# Patient Record
Sex: Male | Born: 1965 | Marital: Single | Smoking: Never smoker
Health system: Southern US, Academic
[De-identification: ages and names within clinical notes are randomized; demographics above are authoritative.]

## PROBLEM LIST (undated history)

## (undated) DIAGNOSIS — Z973 Presence of spectacles and contact lenses: Secondary | ICD-10-CM

## (undated) DIAGNOSIS — K5190 Ulcerative colitis, unspecified, without complications: Secondary | ICD-10-CM

## (undated) DIAGNOSIS — K219 Gastro-esophageal reflux disease without esophagitis: Secondary | ICD-10-CM

## (undated) HISTORY — PX: COLONOSCOPY: WVUENDOPRO10

## (undated) HISTORY — PX: HX KNEE ARTHROSCOPY: SHX127

## (undated) HISTORY — PX: OTHER SURGICAL HISTORY: SHX170

## (undated) HISTORY — PX: GASTROSCOPY: WVUENDOPRO49

---

## 2019-09-08 IMAGING — MR MRI UPPER EXTREMITY WITHOUT AND WITH CONTRAST RT
9 of 10 series · 37 of 40 positions shown · IV contrast (gadavist)
Comparison: None available.

EXAM:  MRI UPPER EXTREMITY WITHOUT AND WITH CONTRAST RT
INDICATION: Recent biceps injury.
TECHNIQUE: Multiplanar multisequential MRI of the right upper arm was performed without and with 6 mL of Gadavist.

[Series 9: T1 fat-sat · sagittal · right · 7.0mm · 1.33mm/px · 4 of 20 slices shown (1 of 3)]
[im 1/20]
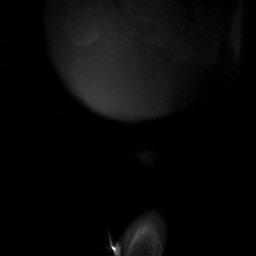
[im 7/20]
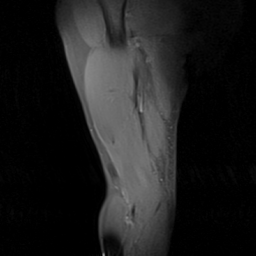
[im 13/20]
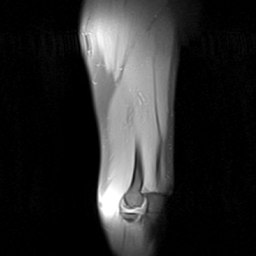
[im 20/20]
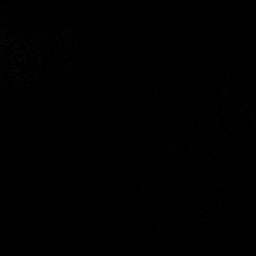

[Series 12: T1 · axial · right · 7.0mm · 0.39mm/px · z∈[-151,+115]mm · 6 of 36 slices shown (1 of 3)]
[im 1/36]
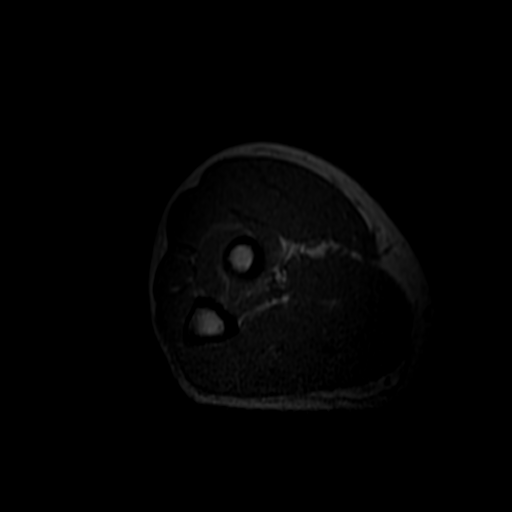
[im 8/36]
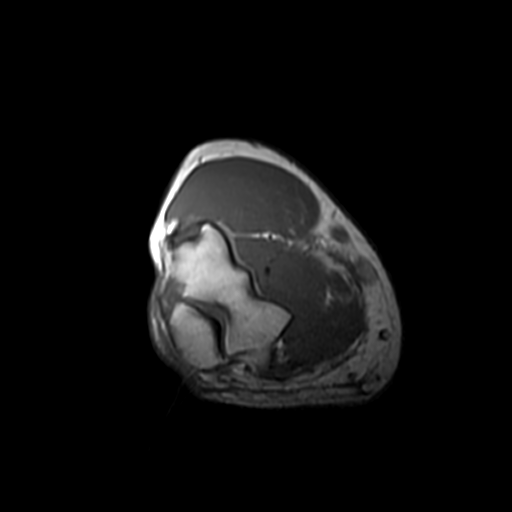
[im 15/36]
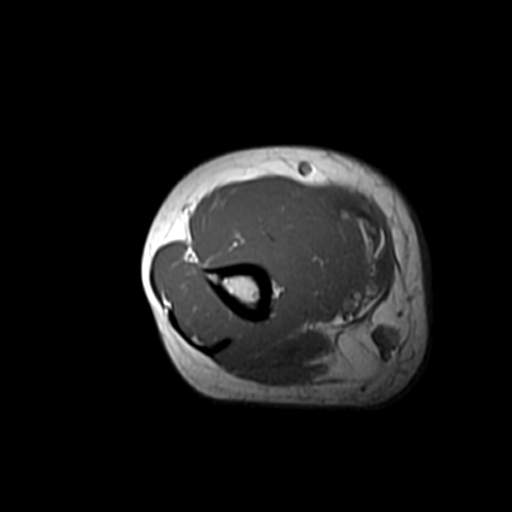
[im 22/36]
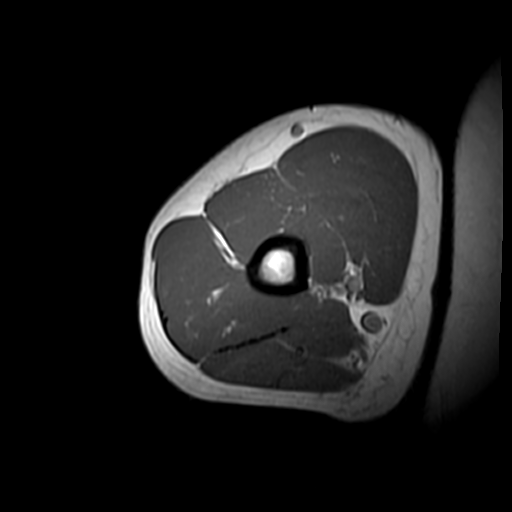
[im 29/36]
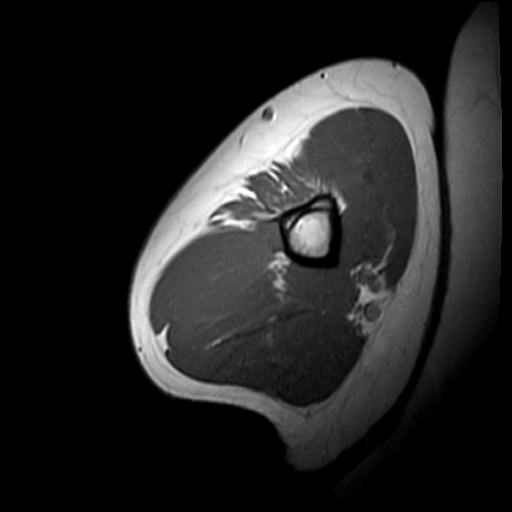
[im 36/36]
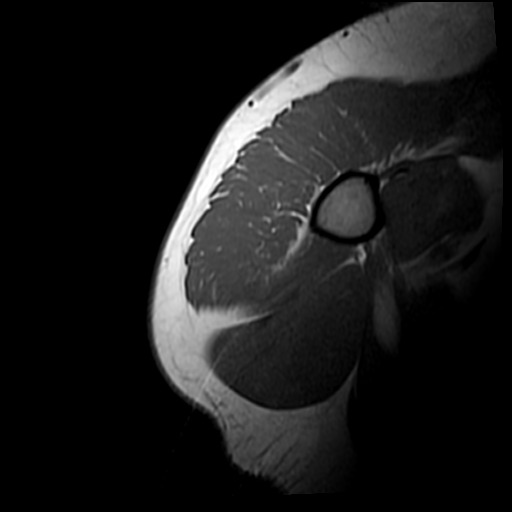

[Series 14: T2 fat-sat · axial · right · 7.0mm · 0.78mm/px · z∈[-151,+115]mm · 5 of 36 slices shown]
[im 1/36]
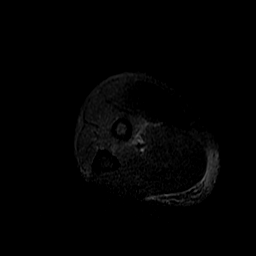
[im 9/36]
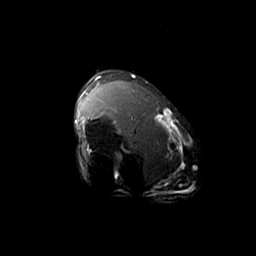
[im 18/36]
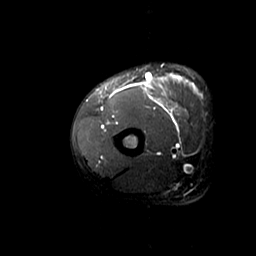
[im 27/36]
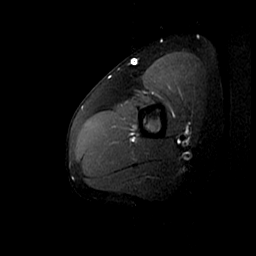
[im 36/36]
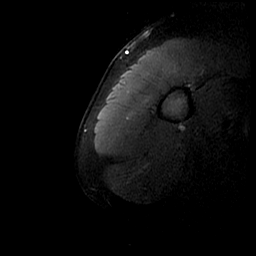

[Series 16: T1 fat-sat post-contrast · axial · right · 7.0mm · 0.78mm/px · z∈[-151,+115]mm · 5 of 36 slices shown (1 of 2)]
[im 1/36]
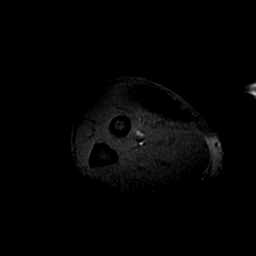
[im 9/36]
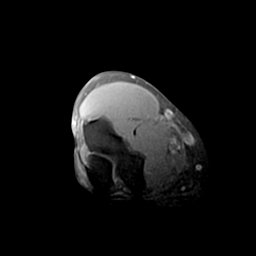
[im 18/36]
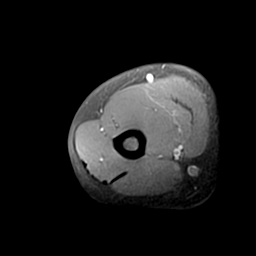
[im 27/36]
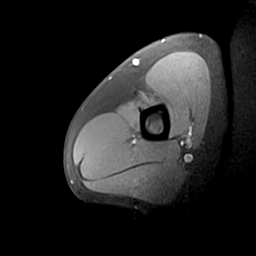
[im 36/36]
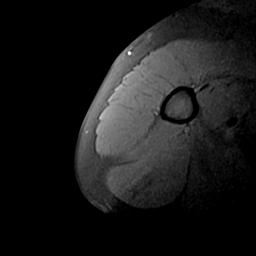

[Series 19: T1 · sagittal · right · 7.0mm · 0.66mm/px · 3 of 20 slices shown (2 of 3)]
[im 1/20]
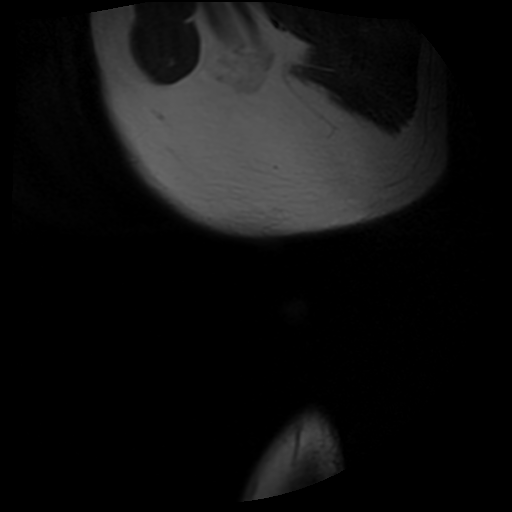
[im 10/20]
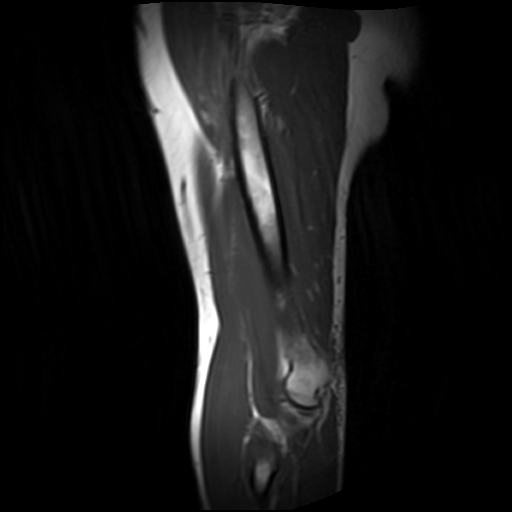
[im 20/20]
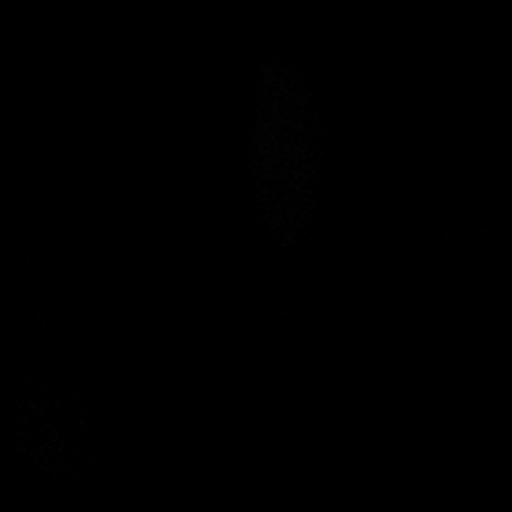

[Series 22: T1 fat-sat · sagittal · right · 7.0mm · 1.33mm/px · 3 of 20 slices shown (2 of 3)]
[im 1/20]
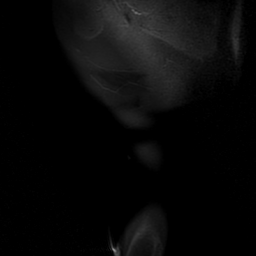
[im 10/20]
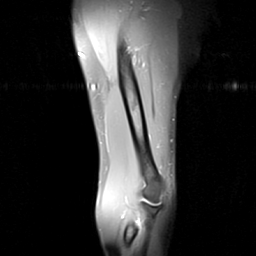
[im 20/20]
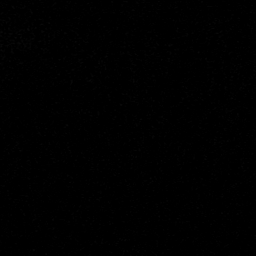

[Series 23: T1 fat-sat · axial · right · 7.0mm · 0.78mm/px · z∈[-151,+115]mm · 5 of 36 slices shown (3 of 3)]
[im 1/36]
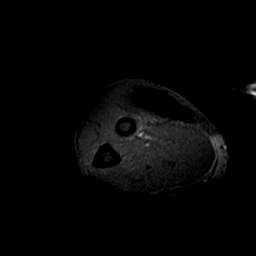
[im 9/36]
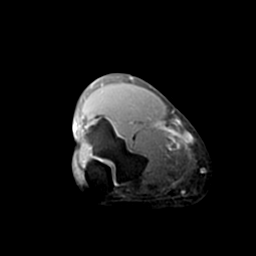
[im 18/36]
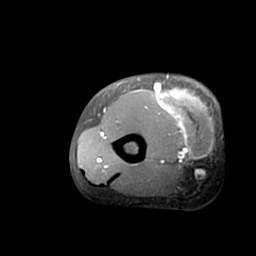
[im 27/36]
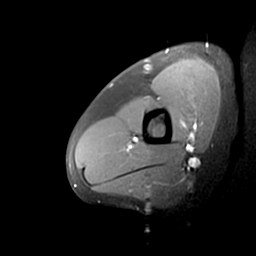
[im 36/36]
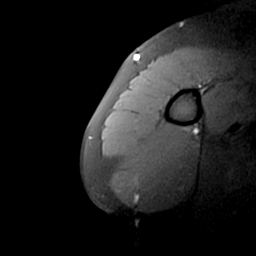

[Series 24: T1 fat-sat post-contrast · coronal · right · 6.0mm · 1.33mm/px · 3 of 22 slices shown (2 of 2)]
[im 1/22]
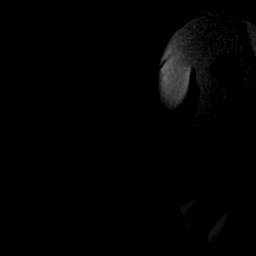
[im 11/22]
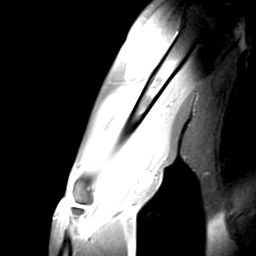
[im 22/22]
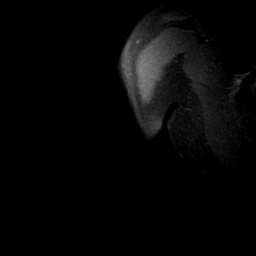

[Series 28: T1 · axial · right · 7.0mm · 0.39mm/px · z∈[-77,+79]mm · 3 of 22 slices shown (3 of 3)]
[im 1/22]
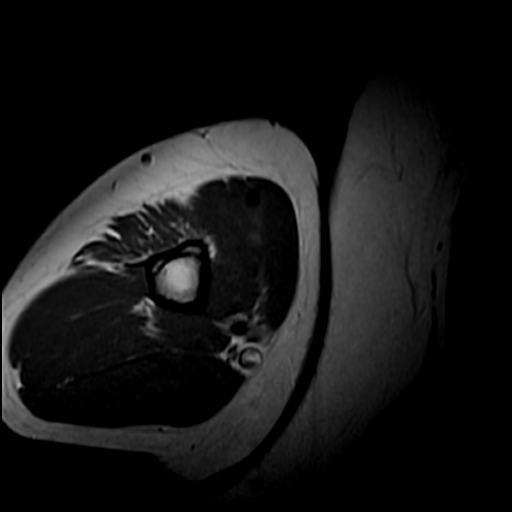
[im 11/22]
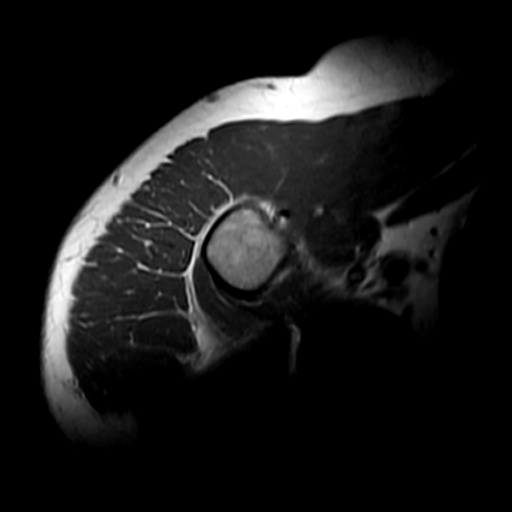
[im 22/22]
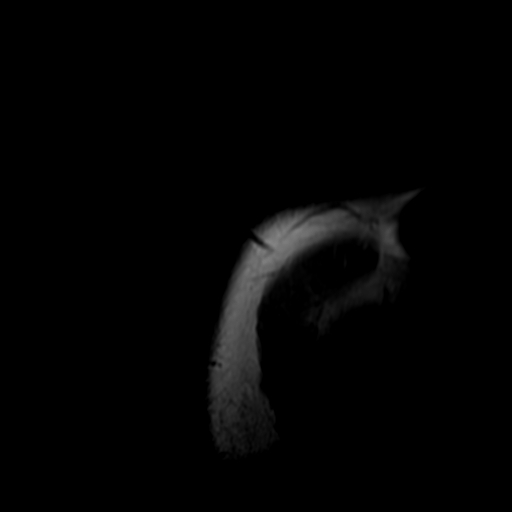

[37 of 40 positions shown; findings below may reference images not displayed]

FINDINGS: Bone marrow signal intensity is normal. There is no acute fracture or subluxation. The distal biceps tendon is completely torn and significantly retracted, located at the level of the distal upper arm. Moderate amount of blood is also noted surrounding the tendon. No suspicious soft tissue mass is seen there is no abnormal enhancement.
IMPRESSION: Completely torn and retracted distal biceps tendon.

## 2019-09-16 ENCOUNTER — Ambulatory Visit (HOSPITAL_BASED_OUTPATIENT_CLINIC_OR_DEPARTMENT_OTHER): Payer: BC Managed Care – PPO

## 2019-09-16 ENCOUNTER — Encounter (HOSPITAL_BASED_OUTPATIENT_CLINIC_OR_DEPARTMENT_OTHER): Payer: Self-pay | Admitting: ORTHOPEDIC, SPORTS MEDICINE

## 2019-09-16 ENCOUNTER — Ambulatory Visit
Admission: RE | Admit: 2019-09-16 | Discharge: 2019-09-16 | Disposition: A | Discharge status: Home / Self Care | Payer: BC Managed Care – PPO | Source: Ambulatory Visit | Attending: ORTHOPEDIC, SPORTS MEDICINE | Admitting: ORTHOPEDIC, SPORTS MEDICINE

## 2019-09-16 ENCOUNTER — Other Ambulatory Visit (HOSPITAL_BASED_OUTPATIENT_CLINIC_OR_DEPARTMENT_OTHER): Payer: Self-pay | Admitting: ORTHOPEDIC, SPORTS MEDICINE

## 2019-09-16 ENCOUNTER — Ambulatory Visit (HOSPITAL_BASED_OUTPATIENT_CLINIC_OR_DEPARTMENT_OTHER)
Admission: RE | Admit: 2019-09-16 | Discharge: 2019-09-16 | Disposition: A | Discharge status: Home / Self Care | Payer: BC Managed Care – PPO | Source: Ambulatory Visit

## 2019-09-16 ENCOUNTER — Ambulatory Visit (HOSPITAL_BASED_OUTPATIENT_CLINIC_OR_DEPARTMENT_OTHER): Payer: BC Managed Care – PPO | Admitting: ORTHOPEDIC, SPORTS MEDICINE

## 2019-09-16 ENCOUNTER — Other Ambulatory Visit: Payer: Self-pay

## 2019-09-16 VITALS — BP 138/86 | HR 84 | Temp 96.8°F | Ht 71.46 in | Wt 284.4 lb

## 2019-09-16 DIAGNOSIS — S46211A Strain of muscle, fascia and tendon of other parts of biceps, right arm, initial encounter: Secondary | ICD-10-CM

## 2019-09-16 DIAGNOSIS — Z01818 Encounter for other preprocedural examination: Secondary | ICD-10-CM | POA: Insufficient documentation

## 2019-09-16 LAB — CBC WITH DIFF
BASOPHIL #: <0.1 10*3/uL (ref <=0.20)
BASOPHIL %: 1 %
EOSINOPHIL #: 0.25 10*3/uL (ref <=0.50)
EOSINOPHIL %: 4 %
HCT: 46.1 % (ref 38.9–52.0)
HGB: 15.6 g/dL (ref 13.4–17.5)
IMMATURE GRANULOCYTE #: <0.1 10*3/uL (ref <0.10)
IMMATURE GRANULOCYTE %: 1 % (ref 0–1)
LYMPHOCYTE #: 1.05 10*3/uL (ref 1.00–4.80)
LYMPHOCYTE %: 17 %
MCH: 31.5 pg (ref 26.0–32.0)
MCHC: 33.8 g/dL (ref 31.0–35.5)
MCV: 92.9 fL (ref 78.0–100.0)
MONOCYTE #: 0.43 10*3/uL (ref 0.20–1.10)
MONOCYTE %: 7 %
MPV: 9.5 fL (ref 8.7–12.5)
NEUTROPHIL #: 4.45 10*3/uL (ref 1.50–7.70)
NEUTROPHIL %: 70 %
PLATELETS: 180 10*3/uL (ref 150–400)
RBC: 4.96 10*6/uL (ref 4.50–6.10)
RDW-CV: 13.1 % (ref 11.5–15.5)
WBC: 6.3 10*3/uL (ref 3.7–11.0)

## 2019-09-16 LAB — BASIC METABOLIC PANEL
ANION GAP: 9 mmol/L (ref 4–13)
BUN/CREA RATIO: 11 (ref 6–22)
BUN: 15 mg/dL (ref 8–25)
CALCIUM: 9.3 mg/dL (ref 8.5–10.2)
CHLORIDE: 105 mmol/L (ref 96–111)
CO2 TOTAL: 25 mmol/L (ref 22–32)
CREATININE: 1.31 mg/dL — ABNORMAL HIGH (ref 0.62–1.27)
GLUCOSE: 110 mg/dL (ref 65–139)
POTASSIUM: 4.3 mmol/L (ref 3.5–5.1)
SODIUM: 139 mmol/L (ref 136–145)

## 2019-09-16 LAB — URINALYSIS, MICROSCOPIC
RBCS: 0 /HPF (ref <6.0)
WBCS: <1 /HPF (ref <4.0)

## 2019-09-16 LAB — ECG 12-LEAD (UTC ONLY)
Atrial Rate: 70 {beats}/min
Calculated P Axis: 43 degrees
Calculated T Axis: 35 degrees
PR Interval: 150 ms
QRS Duration: 74 ms
QT Interval: 350 ms
QTC Calculation: 378 ms
Ventricular rate: 70 {beats}/min

## 2019-09-16 LAB — URINALYSIS, MACROSCOPIC
BILIRUBIN: NEGATIVE mg/dL
BLOOD: NEGATIVE mg/dL
COLOR: NORMAL
GLUCOSE: NEGATIVE mg/dL
KETONES: NEGATIVE mg/dL
LEUKOCYTES: NEGATIVE WBCs/uL
NITRITE: NEGATIVE
PH: 7 (ref 5.0–8.0)
PROTEIN: NEGATIVE mg/dL
SPECIFIC GRAVITY: 1.017 (ref 1.005–1.030)
UROBILINOGEN: NEGATIVE mg/dL

## 2019-09-16 LAB — PT/INR
INR: 0.98 (ref 0.80–1.20)
PROTHROMBIN TIME: 11.3 s (ref 9.1–13.9)

## 2019-09-16 NOTE — H&P (Signed)
[facility]           SPORTS MEDICINE, [place]  [address]  [place] [address]  [phone]    NAME: FALLOU HULBERT  MEDICAL RECORD [id number]  DATE OF BIRTH: [birth year]  DATE OF ENCOUNTER: 09/16/2019    CHIEF COMPLAINT:  Right elbow pain    HISTORY OF PRESENT ILLNESS:  DOUGLES KIMMEY is a 53 y.o. right hand-dominant male that presents to clinic today for evaluation of a right elbow injury in which she injured himself on the job approximately 3 and half weeks ago.  Patient was caring a washing machine and suffered an injury when she felt a pop to the antecubital area of his right elbow.  No prior right elbow or upper extremity issues.  Patient indicates that he had immediate pain with deformity and ecchymosis developed.  Patient was seen at [facility] physicians in [place], [place] where referral was placed given concerns from a distal biceps rupture.  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  Patient denies any radicular pain, numbness or tingling to the Right upper extremity.    PAST MEDICAL HISTORY  GERD    PAST SURGICAL HISTORY  None    BMI  Body mass index is 39.16 kg/m.    MEDICATIONS  Current Outpatient Medications   Medication Sig   . ascorbic acid, vitamin C, (VITAMIN C) 500 mg Oral Tablet Take 500 mg by mouth Once a day   . ergocalciferol, vitamin D2, (VITAMIN D2 ORAL) Take by mouth   . Ibuprofen (MOTRIN) 200 mg Oral Tablet Take 200 mg by mouth Four times a day as needed for Pain   . omeprazole (PRILOSEC) 40 mg Oral Capsule, Delayed Release(E.C.) Take 40 mg by mouth Once a day   . zinc 50 mg Oral Tablet Take 50 mg by mouth Once a day     ALLERGIES  No Known Allergies    SOCIAL HISTORY  Social History     Tobacco Use   . Smoking status: Never Smoker   . Smokeless tobacco: Former Estate agent Use Topics   . Alcohol use: Not on file   . Drug use: Not on file              FAMILY HISTORY    Father had a history of myocardial infarction in his mid 36s.      No history of bleeding disorders or adverse reactions to anesthesia     REVIEW OF SYSTEMS  Musculoskeletal and neurologic systems reviewed per the HPI and all others negative except for those that are also listed on the intake form.    PHYSICAL EXAM  BP 138/86   Pulse 84   Temp 36 C (96.8 F)   Ht 1.815 m (5' 11.46")   Wt 129 kg (284 lb 6.3 oz)   BMI 39.16 kg/m     Patient is alert and oriented x3, no apparent distress, compliant with medical examination.  Upon inspection of the right upper extremity, he has a reverse Popeye deformity of the biceps.  He has a positive hook test on the right.  Patient has a cramping sensation with resisted supination.  Patient has full flexion extension of the right elbow and full pronation and supination of the forearm.  Otherwise motor and sensory intact, distal pulses intact right upper extremity.    IMAGING:  MRI of the Right elbow taken  at an outside facility shows evidence of a full-thickness distal biceps rupture with retraction of the tendon stump approximately 11 centimeters proximal to the radial tuberosity.  No other musculoskeletal abnormalities are apparent.    AP and lateral chest x-ray with interpretation from the radiologist shows no overt cardiopulmonary process with normal contours of the cardiac silhouette.  Normal hilar lung markings with intermittent opacities to the right and left lung tissues that are small and circumscribed in nature.  No evidence of any osseous abnormalities.    ASSESSMENT: 53 y.o. male with a Right distal biceps rupture.    PLAN:  We discussed with the patient the nature of the condition and the functional impairments as a consequence.  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  We discussed the prospects of right distal biceps repair through a one-incision approach.  Patient wishes to proceed.  Risks and benefits of the  surgery discussed with the patient including, but not limited to, bleeding, infection, pain, damage to adjacent structures, need for further surgery, risk of the repair not healing or re-tearing, potential for elbow stiffness, potential for residual elbow weakness, risk of developing compartment syndrome, risk for development of heterotopic ossification or synostosis, transient or permanent loss of sensation overlying the skin including lateral cutaneous nerve of the forearm, risks of anesthesia, potential for development of a DVT.  Patient understands that they will need to comply with post-operative restrictions and participate in physical therapy to maximize outcome.  Patient has signed the informed consent for single incision distal biceps repair.  If the patient has any questions or concerns, they will contact our office.    Laboratory work, chest x-ray, and ECG were ordered.  Patient does not have a PCP.  Will have the patient be seen in PAT appointment.    Also, patient was given a tube of benzoyl peroxide to help guard against bacterial burden of C. Acnes.   Patient was given instructions for administration 4 days prior to procedure with the dosing twice a day, with the last dose on the morning of surgery.      As part of today's pre-operative visit we also discussed the risk of opioid addiction associated with Freeman Caldron Trigueros's post operative medication of Norco. We reviewed the CSMP for any current or previously prescribed medications. We discussed that this medication must be taken as prescribed. It is not to be taken with any alcohol or mood altering drugs. We discussed transitioning to an over-the-counter pain medication as quickly as possible. It was explained that should this medication be lost, stolen or damaged it will not be replaced, for this reason we explained the importance of keeping this prescription in a secure and safe location. We explained should a refill need provided this refill will  deescalate in strength and or duration. Kermit Balo was given a copy of the current Opioid Reduction Act at today's visit and a pain contract was signed at today's visit.       Drucilla Schmidt, MD

## 2019-09-17 LAB — HGA1C (HEMOGLOBIN A1C WITH EST AVG GLUCOSE)
ESTIMATED AVERAGE GLUCOSE: 91 mg/dL
HEMOGLOBIN A1C: 4.8 % (ref 4.0–5.6)

## 2019-09-18 ENCOUNTER — Encounter (HOSPITAL_COMMUNITY): Payer: Self-pay

## 2019-09-21 ENCOUNTER — Encounter (HOSPITAL_COMMUNITY): Payer: Self-pay

## 2019-09-22 ENCOUNTER — Inpatient Hospital Stay (HOSPITAL_COMMUNITY)
Admission: RE | Admit: 2019-09-22 | Discharge: 2019-09-22 | Disposition: A | Discharge status: Home / Self Care | Payer: BC Managed Care – PPO | Source: Ambulatory Visit

## 2019-09-22 ENCOUNTER — Encounter (HOSPITAL_COMMUNITY): Payer: Self-pay

## 2019-09-22 ENCOUNTER — Other Ambulatory Visit: Payer: Self-pay

## 2019-09-22 HISTORY — DX: Presence of spectacles and contact lenses: Z97.3

## 2019-09-22 HISTORY — DX: Gastro-esophageal reflux disease without esophagitis: K21.9

## 2019-09-23 MED ORDER — DEXTROSE 5 % IN WATER (D5W) INTRAVENOUS SOLUTION
3.0000 g | Freq: Once | INTRAVENOUS | Status: AC
Start: 2019-09-24 — End: 2019-09-24
  Administered 2019-09-24: 3 g via INTRAVENOUS
  Filled 2019-09-23: qty 30

## 2019-09-24 ENCOUNTER — Encounter (HOSPITAL_COMMUNITY): Payer: Self-pay | Admitting: ORTHOPEDIC, SPORTS MEDICINE

## 2019-09-24 ENCOUNTER — Inpatient Hospital Stay
Admission: RE | Admit: 2019-09-24 | Discharge: 2019-09-24 | Disposition: A | Discharge status: Home / Self Care | Payer: BC Managed Care – PPO | Source: Ambulatory Visit | Attending: ORTHOPEDIC, SPORTS MEDICINE | Admitting: ORTHOPEDIC, SPORTS MEDICINE

## 2019-09-24 ENCOUNTER — Ambulatory Visit (HOSPITAL_COMMUNITY)
Admission: RE | Admit: 2019-09-24 | Discharge: 2019-09-24 | Disposition: A | Discharge status: Home / Self Care | Payer: BC Managed Care – PPO | Source: Ambulatory Visit

## 2019-09-24 ENCOUNTER — Other Ambulatory Visit: Payer: Self-pay

## 2019-09-24 ENCOUNTER — Ambulatory Visit (HOSPITAL_COMMUNITY): Payer: BC Managed Care – PPO | Admitting: ANESTHESIOLOGY

## 2019-09-24 ENCOUNTER — Ambulatory Visit (HOSPITAL_COMMUNITY): Payer: BC Managed Care – PPO | Admitting: ORTHOPEDIC, SPORTS MEDICINE

## 2019-09-24 ENCOUNTER — Ambulatory Visit (HOSPITAL_BASED_OUTPATIENT_CLINIC_OR_DEPARTMENT_OTHER): Payer: BC Managed Care – PPO | Admitting: ANESTHESIOLOGY

## 2019-09-24 ENCOUNTER — Encounter (HOSPITAL_COMMUNITY)
Admission: RE | Disposition: A | Discharge status: Home / Self Care | Payer: Self-pay | Source: Ambulatory Visit | Attending: ORTHOPEDIC, SPORTS MEDICINE

## 2019-09-24 DIAGNOSIS — S46201A Unspecified injury of muscle, fascia and tendon of other parts of biceps, right arm, initial encounter: Secondary | ICD-10-CM

## 2019-09-24 DIAGNOSIS — S46211A Strain of muscle, fascia and tendon of other parts of biceps, right arm, initial encounter: Secondary | ICD-10-CM | POA: Insufficient documentation

## 2019-09-24 HISTORY — DX: Ulcerative colitis, unspecified, without complications (CMS HCC): K51.90

## 2019-09-24 SURGERY — REPAIR TENDON BICEPS
Anesthesia: General | Site: Shoulder | Laterality: Right | Wound class: Clean Wound: Uninfected operative wounds in which no inflammation occurred

## 2019-09-24 MED ORDER — FENTANYL (PF) 50 MCG/ML INJECTION SOLUTION
Freq: Once | INTRAMUSCULAR | Status: DC | PRN
Start: 2019-09-24 — End: 2019-09-24
  Administered 2019-09-24: 25 ug via INTRAVENOUS
  Administered 2019-09-24: 50 ug via INTRAVENOUS
  Administered 2019-09-24: 25 ug via INTRAVENOUS
  Administered 2019-09-24: 100 ug via INTRAVENOUS

## 2019-09-24 MED ORDER — LIDOCAINE (PF) 100 MG/5 ML (2 %) INTRAVENOUS SYRINGE
INJECTION | Freq: Once | INTRAVENOUS | Status: DC | PRN
Start: 2019-09-24 — End: 2019-09-24
  Administered 2019-09-24: 100 mg via INTRAVENOUS

## 2019-09-24 MED ORDER — SODIUM CHLORIDE 0.9 % IRRIGATION SOLUTION
Freq: Once | OPHTHALMIC | Status: DC | PRN
Start: 2019-09-24 — End: 2019-09-24
  Administered 2019-09-24: 13:00:00

## 2019-09-24 MED ORDER — PROPOFOL 10 MG/ML IV BOLUS
INJECTION | Freq: Once | INTRAVENOUS | Status: DC | PRN
Start: 2019-09-24 — End: 2019-09-24
  Administered 2019-09-24: 50 mg via INTRAVENOUS
  Administered 2019-09-24: 200 mg via INTRAVENOUS

## 2019-09-24 MED ORDER — EPHEDRINE SULFATE 50 MG/ML INJECTION SOLUTION
Freq: Once | INTRAMUSCULAR | Status: DC | PRN
Start: 2019-09-24 — End: 2019-09-24
  Administered 2019-09-24 (×2): 5 mg via INTRAVENOUS

## 2019-09-24 MED ORDER — GLYCOPYRROLATE 0.2 MG/ML INJECTION SOLUTION
Freq: Once | INTRAMUSCULAR | Status: DC | PRN
Start: 2019-09-24 — End: 2019-09-24
  Administered 2019-09-24: 0.8 mg via INTRAVENOUS

## 2019-09-24 MED ORDER — DOCUSATE SODIUM 100 MG CAPSULE
100.00 mg | ORAL_CAPSULE | Freq: Two times a day (BID) | ORAL | 0 refills | Status: AC
Start: 2019-09-24 — End: 2019-10-04

## 2019-09-24 MED ORDER — FENTANYL (PF) 50 MCG/ML INJECTION SOLUTION
25.0000 ug | INTRAMUSCULAR | Status: AC | PRN
Start: 2019-09-24 — End: 2019-09-24
  Administered 2019-09-24 (×4): 25 ug via INTRAVENOUS
  Filled 2019-09-24: qty 2

## 2019-09-24 MED ORDER — ROCURONIUM 10 MG/ML INTRAVENOUS SOLUTION
Freq: Once | INTRAVENOUS | Status: DC | PRN
Start: 2019-09-24 — End: 2019-09-24
  Administered 2019-09-24: 50 mg via INTRAVENOUS

## 2019-09-24 MED ORDER — FENTANYL (PF) 50 MCG/ML INJECTION SOLUTION
12.5000 ug | INTRAMUSCULAR | Status: DC | PRN
Start: 2019-09-24 — End: 2019-09-24

## 2019-09-24 MED ORDER — NEOSTIGMINE METHYLSULFATE 5 MG/5 ML (1 MG/ML) INTRAVENOUS SYRINGE
INJECTION | Freq: Once | INTRAVENOUS | Status: DC | PRN
Start: 2019-09-24 — End: 2019-09-24
  Administered 2019-09-24: 4 mg via INTRAVENOUS

## 2019-09-24 MED ORDER — MIDAZOLAM 1 MG/ML INJECTION SOLUTION
Freq: Once | INTRAMUSCULAR | Status: DC | PRN
Start: 2019-09-24 — End: 2019-09-24
  Administered 2019-09-24: 2 mg via INTRAVENOUS

## 2019-09-24 MED ORDER — METOCLOPRAMIDE 5 MG/ML INJECTION SOLUTION
10.0000 mg | Freq: Once | INTRAMUSCULAR | Status: DC | PRN
Start: 2019-09-24 — End: 2019-09-24
  Filled 2019-09-24: qty 2

## 2019-09-24 MED ORDER — ONDANSETRON HCL (PF) 4 MG/2 ML INJECTION SOLUTION
4.0000 mg | Freq: Once | INTRAMUSCULAR | Status: DC
Start: 2019-09-24 — End: 2019-09-24

## 2019-09-24 MED ORDER — LACTATED RINGERS INTRAVENOUS SOLUTION
INTRAVENOUS | Status: DC
Start: 2019-09-24 — End: 2019-09-24
  Administered 2019-09-24 (×2): via INTRAVENOUS
  Administered 2019-09-24: 0 via INTRAVENOUS
  Administered 2019-09-24 (×3): via INTRAVENOUS

## 2019-09-24 MED ORDER — ONDANSETRON HCL (PF) 4 MG/2 ML INJECTION SOLUTION
Freq: Once | INTRAMUSCULAR | Status: DC | PRN
Start: 2019-09-24 — End: 2019-09-24
  Administered 2019-09-24: 4 mg via INTRAVENOUS

## 2019-09-24 MED ORDER — BUPIVACAINE (PF) 0.25 % (2.5 MG/ML) INJECTION SOLUTION
20.0000 mL | Freq: Once | INTRAMUSCULAR | Status: DC | PRN
Start: 2019-09-24 — End: 2019-09-24
  Administered 2019-09-24: 10 mL

## 2019-09-24 MED ORDER — DEXAMETHASONE SODIUM PHOSPHATE 4 MG/ML INJECTION SOLUTION
Freq: Once | INTRAMUSCULAR | Status: DC | PRN
Start: 2019-09-24 — End: 2019-09-24
  Administered 2019-09-24: 12:00:00 8 mg via INTRAVENOUS

## 2019-09-24 MED ORDER — SODIUM CHLORIDE 0.9 % (FLUSH) INJECTION SYRINGE
2.0000 mL | INJECTION | INTRAMUSCULAR | Status: DC | PRN
Start: 2019-09-24 — End: 2019-09-24

## 2019-09-24 MED ORDER — PROCHLORPERAZINE EDISYLATE 10 MG/2 ML (5 MG/ML) INJECTION SOLUTION
5.0000 mg | Freq: Once | INTRAMUSCULAR | Status: DC | PRN
Start: 2019-09-24 — End: 2019-09-24
  Filled 2019-09-24: qty 2

## 2019-09-24 MED ORDER — SODIUM CHLORIDE 0.9 % (FLUSH) INJECTION SYRINGE
2.0000 mL | INJECTION | Freq: Three times a day (TID) | INTRAMUSCULAR | Status: DC
Start: 2019-09-24 — End: 2019-09-24
  Administered 2019-09-24: 14:00:00

## 2019-09-24 MED ORDER — OXYCODONE-ACETAMINOPHEN 5 MG-325 MG TABLET
1.0000 | ORAL_TABLET | Freq: Once | ORAL | Status: DC | PRN
Start: 2019-09-24 — End: 2019-09-24

## 2019-09-24 MED ORDER — HYDROMORPHONE 2 MG/ML INJECTION SYRINGE
0.4000 mg | INJECTION | INTRAMUSCULAR | Status: DC | PRN
Start: 2019-09-24 — End: 2019-09-24

## 2019-09-24 MED ORDER — ACETAMINOPHEN 1,000 MG/100 ML (10 MG/ML) INTRAVENOUS SOLUTION
Freq: Once | INTRAVENOUS | Status: DC | PRN
Start: 2019-09-24 — End: 2019-09-24
  Administered 2019-09-24: 1000 mg via INTRAVENOUS

## 2019-09-24 MED ORDER — HYDROMORPHONE 2 MG/ML INJECTION SYRINGE
0.2000 mg | INJECTION | INTRAMUSCULAR | Status: DC | PRN
Start: 2019-09-24 — End: 2019-09-24

## 2019-09-24 MED ORDER — LACTATED RINGERS INTRAVENOUS SOLUTION
INTRAVENOUS | Status: DC
Start: 2019-09-24 — End: 2019-09-24
  Administered 2019-09-24: 11:00:00

## 2019-09-24 MED ORDER — OXYCODONE-ACETAMINOPHEN 5 MG-325 MG TABLET
1.0000 | ORAL_TABLET | Freq: Once | ORAL | Status: AC
Start: 2019-09-24 — End: 2019-09-24
  Administered 2019-09-24: 1 via ORAL
  Filled 2019-09-24: qty 1

## 2019-09-24 MED ORDER — KETAMINE 10 MG/ML INJECTION WRAPPER
Freq: Once | INTRAMUSCULAR | Status: DC | PRN
Start: 2019-09-24 — End: 2019-09-24
  Administered 2019-09-24: 30 mg via INTRAVENOUS

## 2019-09-24 MED ORDER — HYDROCODONE 5 MG-ACETAMINOPHEN 325 MG TABLET
1.00 | ORAL_TABLET | Freq: Four times a day (QID) | ORAL | 0 refills | Status: AC | PRN
Start: 2019-09-24 — End: 2019-09-30

## 2019-09-24 SURGICAL SUPPLY — 50 items
APPL 70% ISPRP 2% CHG 26ML 13._2X13.2IN CHLRPRP PREP DEHP-FR (WOUND CARE/ENTEROSTOMAL SUPPLY) ×2
APPL 70% ISPRP 2% CHG 26ML CHLRPRP HI-LT ORNG PREP STRL LF  DISP CLR (WOUND CARE SUPPLY) ×2 IMPLANT
BANDAGE COFLX NL 5YDX6IN_EASYTEAR ADH CHSV PAT TCH STRN (WOUND CARE/ENTEROSTOMAL SUPPLY) ×1
BANDAGE HK-LOCK 5YDX4IN KNIT E LAS UNQ LPLK DSGN WSHBL FRN (WOUND CARE/ENTEROSTOMAL SUPPLY) ×1
BANDAGE HK-LOCK 5YDX6IN KNIT_ELAS UNQ LPLK DSGN WSHBL FRN (WOUND CARE/ENTEROSTOMAL SUPPLY) ×1
BANDAGE HOOK LOCK 5YDX6IN NONST 2 HOOK CLSR KNIT ELAS LPLK PLSTR COTTON COMPRESS LF  REUSE (WOUND CARE SUPPLY) ×1 IMPLANT
BANDAGE HOOK-LOCK 5YDX4IN STRL KNIT ELAS UNQ LPLK DSGN WSHBL FRN CLSR VELCRO PLSTR YARN COTTON (WOUND CARE SUPPLY) ×1 IMPLANT
BLANKET MISTRAL-AIR ADULT UPR BODY 79X29.9IN FRC AIR HI VOL BLWR INTUITIVE CONTROL PNL LRG LED (MISCELLANEOUS PT CARE ITEMS) ×1 IMPLANT
BLANKET MISTRAL-AIR UPR BODY 7_8.7X29.9IN FRC AIR WARM (MISCELLANEOUS PT CARE ITEMS) ×1
CLOSURE SKIN STRIPS 1/2X4IN_R1547 6/PK 50PK/BX (WOUND CARE/ENTEROSTOMAL SUPPLY) ×1
CONV USE 328206 - HOSE EXT 36IN 2 POS LOCK CONN STRL LF  DISP (SURGICAL INSTRUMENTS) ×1 IMPLANT
CONV USE ITEM 108095 - SYRINGE DOVER 60ML LF  STRL IRRG PROTECT CAP BULB (NEEDLES & SYRINGE SUPPLIES) ×1 IMPLANT
CONV USE ITEM 313924 - BANDAGE COFLX NL 5YDX6IN_EASYTEAR ADH CHSV PAT TCH STRN (WOUND CARE SUPPLY) ×1 IMPLANT
CONV USE ITEM 337890 - PACK SURG BSIN 2 STRL LF  DISP (CUSTOM TRAYS & PACK) ×1 IMPLANT
DEVICE FIX BIOCOMP DIST BICEPS REPR ×1 IMPLANT
DRAPE ADH 51X47IN STRDRP LF  STRL DISP SURG CLR (PROTECTIVE PRODUCTS/GARMENTS) ×2 IMPLANT
DRAPE ADH 51X47IN U STRDRP LF_STRL DISP SURG PLASTIC CLR (PROTECTIVE PRODUCTS/GARMENTS) ×2
DRAPE FLRSCN CARM STRAP 85X54IN MINI KOVER LF  STRL EQP POLY (DRAPE/PACKS/SHEETS/OR TOWEL) ×1 IMPLANT
DRAPE FLRSCN CARM STRAP 85X54I_N MINI KOVER LF STRL EQP POLY (DRAPE/PACKS/SHEETS/OR TOWEL) ×1
DRESSING XEROFORM 1 X 8IN 8884431302 (WOUND CARE SUPPLY) ×1 IMPLANT
DRESSING XEROFORM 1 X 8IN 8884431302 (WOUND CARE/ENTEROSTOMAL SUPPLY) ×1
ELECTRODE ESURG BLADE PNCL 10FT VLAB STRL SS DISP BUTTON SWH HEX LOCK CORD HLSTR LF  ACPT 3/32IN STD (CAUTERY SUPPLIES) ×1 IMPLANT
ELECTRODE ESURG BLADE PNCL 10F_T VLAB STRL SS DISP BUTTON SWH (CAUTERY SUPPLIES) ×1
GARMENT COMPRESS MED CALF CENTAURA NYL VASOGRAD LTWT BRTHBL SEQ FIL BLU 18- IN (ORTHOPEDICS (NOT IMPLANTS)) ×1 IMPLANT
GARMENT COMPRESS MED CALF CENT_AURA NYL VASOGRAD LTWT BRTHBL (ORTHOPEDICS (NOT IMPLANTS)) ×1
HANDPC SUCT MEDIVAC YANKAUER BLBS TIP CLR STRL LF  DISP (Suction) ×1 IMPLANT
HANDPC SUCT MEDIVAC YANKAUER B_LBS TIP CLR STRL LF DISP (Suction) ×1
HOSE EXT 36IN 2 POS LOCK CONN STRL LF  DISP (INSTRUMENTS) ×1
HOSE EXT 36IN 2 POS LOCK CONN_STRL LF DISP (INSTRUMENTS) ×1
PACK BASIN DBL CUSTOM (CUSTOM TRAYS & PACK) ×1
PACK HAND TRAY ORTHO REVISED_UTC (CUSTOM TRAYS & PACK) ×1
PACK SURG UTC HAND STRL DISP LF (CUSTOM TRAYS & PACK) ×1 IMPLANT
SCRUB SURG 10.5X9IN CLTH CHG R INSE FREE NONST LF FRGRNC (PATU) ×2 IMPLANT
SLING ARM COMFORT LRG_W/SHOULDER PAD 8017-53 (ORTHOPEDICS (NOT IMPLANTS)) ×1
SLING ORTHO FM UNIV SHLDR ABD_PLW UNQ PSTL GRP (SOFT) ×1
SLING ORTHO FOAM UNIV SHLDR ABDUCT PILLOW UNQ PSTL GRIP (SOFT) ×1 IMPLANT
SLING ORTHO LRG 18X6IN ARM NRW CLS PCH SHLDR STRAP BCKL CLSR COTTON NONST LF (ORTHOPEDICS (NOT IMPLANTS)) ×1 IMPLANT
SPLINT ORTHO 15FTX4IN RL FBGLS ORTHOGLASS LF (CAST) ×1 IMPLANT
SPLINT ORTHO 15FTX4IN WRST WRN_KL FREE STRCH PAD CVR INTLK (CAST) ×1
SPONGE LAP 18X18 RFD STRL_L181804P01C1 40PK/CS (WOUND CARE/ENTEROSTOMAL SUPPLY) ×1
SPONGE LAP 18X18IN STD 4 PLY XRY RF DTBL ABS RFDETECT COTTON STRL LF  DISP (WOUND CARE SUPPLY) ×1 IMPLANT
STKNT ORTHO 48X12IN IMPRV SPD RL XL STRL (ORTHOPEDICS (NOT IMPLANTS)) IMPLANT
STOCKINETTE IMPERV STRL XL_SYNTHETIC STRL 12X48IN 10RL/CS (ORTHOPEDICS (NOT IMPLANTS))
STRIP 4X.5IN STRSTRP PLSTR REINF SKNCLS WHT STRL LF (WOUND CARE SUPPLY) ×1 IMPLANT
SYRINGE 50CC LF STRL IRRG FIN_GER FLNG BULB DISP (NEEDLES & SYRINGE SUPPLIES) ×1
SYRINGE DOVER 60ML LF  STRL IRRG PROTECT CAP BULB (NEEDLES & SYRINGE SUPPLIES) ×1
TIP SUCT ARGYLE CURITY FRZR 10FR BRSS PLASTIC NI REM OBDURATOR NCDTV HNDL STRL LF  DISP (SURGICAL INSTRUMENTS) ×1 IMPLANT
TUBE FRAZIER INST SUCT 10FR_166025 (INSTRUMENTS) ×1
TUBING SUCT CLR 20FT 9/32IN MEDIVAC NCDTV M/M CONN STRL LF (Suction) ×1 IMPLANT
TUBING SUCT CONN 20FT LONG_STRL N720A (Suction) ×1

## 2019-09-24 NOTE — Discharge Instructions (Signed)
SURGICAL DISCHARGE INSTRUCTIONS     Dr. Augustin Coupe, Derik, MD  performed your REPAIR TENDON BICEPS today at the [place]:  Monday through Friday from 6 a.m. - 7 p.m.: [phone]  Between 7 p.m. - 6 a.m., weekends and holidays:  Call [facility] at [phone] or [phone].    PLEASE SEE WRITTEN HANDOUTS AS DISCUSSED BY YOUR NURSE    SIGNS AND SYMPTOMS OF A WOUND / INCISION INFECTION   Be sure to watch for the following:   Increase in redness or red streaks near or around the wound or incision.   Increase in pain that is intense or severe and cannot be relieved by the pain medication that your doctor has given you.   Increase in swelling that cannot be relieved by elevation of a body part, or by applying ice, if permitted.   Increase in drainage, or if yellow / green in color and smells bad. This could be on a dressing or a cast.   Increase in fever for longer than 24 hours, or an increase that is higher than 101 degrees Fahrenheit (normal body temperature is 98 degrees Fahrenheit). The incision may feel warm to the touch.    **CALL YOUR DOCTOR IF ONE OR MORE OF THESE SIGNS / SYMPTOMS SHOULD OCCUR.    ANESTHESIA INFORMATION   ANESTHESIA -- ADULT PATIENTS:  You have received intravenous sedation / general anesthesia, and you may feel drowsy and light-headed for several hours. You may even experience some forgetfulness of the procedure. DO NOT DRIVE A MOTOR VEHICLE or perform any activity requiring complete alertness or coordination until you feel fully awake in about 24-48 hours. Do not drink alcoholic beverages for at least 24 hours. Do not stay alone, you must have a responsible adult available to be with you. You may also experience a dry mouth or nausea for 24 hours. This is a normal side effect and will disappear as the effects of the medication wear off.    REMEMBER   If you experience any difficulty breathing, chest pain, bleeding that you feel is excessive,  persistent nausea or vomiting or for any other concerns:  Call your physician Dr. Augustin Coupe at [phone] or [phone]. You may also ask to have the doctor on call paged. They are available to you 24 hours a day.    SPECIAL INSTRUCTIONS / COMMENTS   See attachments    FOLLOW-UP APPOINTMENTS   Please call patient services at [phone] or [phone] to schedule a date / time of return. They are open Monday - Friday from 7:30 am - 5:00 pm.

## 2019-09-24 NOTE — Progress Notes (Signed)
[facility]  Orthopaedics Post Op Note/Discharge Summary  Date of Service: 09/24/2019    Patient: Nathan Hale  MRN: [id number]  DOB: 1966/07/07    S: Patient to PACU in hemodynamically stable condition. Resting comfortably, maintaining airway. Pain tolerable    O:   NAD  AFVSS  RUE:  SILT over R R/M/U distribution  Makes thumbs up, okay sign, and spreads fingers  BCR to all fingers  Dressing: splint intact      A/P: 53 y.o. male day of surgery s/p R distal biceps tendon repair  - resume all home medications   - NWB RUE in splint   - DVT prophylaxis:  Encourage ambulation and ROM in all normal extremities  - Antibiotics: given preop  - pain control rx given  - resume home diet   - Will see back in Dr. Augustin Coupe clinic 10/27 weeks. Ordered in Epic.   - Dispo: D/C home when recovery area discharge criteria is met    Sula Soda, MD  Orthopaedics Resident  Pager [phone]  Sula Soda, MD 09/24/2019, 14:03      ____________________________________________________________________________________________  I have seen and examined the patient with the resident, reviewed and agree with Dr. Blanchie Serve note with the following additions/addendums as written.  I personally went over the plan and findings with the patient, and brother via phone.    Tashima Scarpulla Geraldine Contras, MD  Assistant Professor  [facility] Department of Orthopaedics

## 2019-09-24 NOTE — H&P (Signed)
[facility]  H&P Update Form    Derald, Lorge, 53 y.o. male  Encounter Start Date:  09/24/2019  Date of Birth:  Feb 14, 1966    09/24/2019    STOP: IF H&P IS GREATER THAN 30 DAYS FROM SURGICAL DAY COMPLETE NEW H&P IS REQUIRED.     H & P updated the day of the procedure.  1.  H&P completed within 30 days of surgical procedure by Dr. Augustin Coupe on 09/16/2019 and has been reviewed within 24 hours of the surgery, the patient has been examined, and no change has occured in the patients condition since the H&P was completed.       Change in medications: No          @[email or web address] ([id number],lastmenstrualperiod,,1)@      Comments:     2.  Patient continues to be appropiate candidate for planned surgical procedure. YES      Drucilla Schmidt, MD

## 2019-09-24 NOTE — OR Surgeon (Signed)
NAME: Nathan Hale  MEDICAL RECORD [id number]  DATE OF BIRTH: [birth year]  DATE OF PROCEDURE: 09/24/2019      PRE-OP DIAGNOSIS:  Right distal biceps rupture     POST-OP DIAGNOSIS:  Right distal biceps rupture     OPERATIVE PROCEDURE: Right distal biceps repair (CPT 08144)     SURGEON: Betzaira Mentel MD  ASSISTANTSheppard Coil MD     ANESTHESIA:  GETA     EBL:  20 ml  FLUIDS: per the Anesthesia record  ANTIBIOTICS:  Ancef IVPB  TOURNIQUET TIME: Zero minutes    IMPLANTS: Arthrex interference screw fixation 54mm x 10 mm with endobutton fixation using a tension slide technique    COMPLICATIONS:  None apparent      HISTORY OF PRESENT ILLNESS:  Nathan Hale is a 53 y.o. right hand-dominant male that presented to clinic for evaluation of a right elbow injury in which he injured himself on the job approximately 4-5 weeks ago.  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  No prior right elbow or upper extremity issues.  Patient indicates that he had immediate pain with deformity and ecchymosis developed.  Patient was seen at [facility] in [place], [place] where referral was placed given concerns from a distal biceps rupture.  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  Patient denies any radicular pain, numbness or tingling to the Right upper extremity.  We had discussed Right distal biceps repair for functional status.    Risks and benefits of the surgery discussed with the patient including, but not limited to, bleeding, infection, pain, damage to adjacent structures, need for further surgery, risk of the biceps repair not healing or re-tearing, potential for elbow stiffness or loss of range of motion, potential for residual supination or elbow flexion weakness, risk of developing compartment syndrome, risk for development of heterotopic ossification, transient or  permanent loss of sensation overlying the skin including distributions of the lateral antebrachial cutaneous nerve and median nerve, risks of anesthesia including neurological compromise from neurovascular blocks, potential for development of a DVT.  Patient wished to proceed and signed the informed consent after all questions answered.     PROCEDURE:  Tourniquet was not applied to the Right upper arm and not needed throughout the case.  A non-sterile barrier drape was placed. The entire arm was pre-prepped with chlorhexadine-based cloths, then sterilely prepped with a chlorhexadine-based prepping solution. The upper extremity was then sterilely draped.  A time out was performed confirming patient identity, the designated operative extremity, that appropriate pre-operative antibiotics were administered and the proposed procedures were outlined.  All parties agreed that the procedure could commence.       The tourniquet was not inflated during the procedure.  Using mini-fluoroscopy the Right radial tuberosity was identified and a skin marker was used to draw a 3-4 cm transverse incision line just proximal to that level.  A 15 blade scalpel was used to incise the skin to the subcutaneous tissues.  Dissection with scissors was performed through the subcutaneous fat and the lateral antebrachial cutaneous nerve was eventually identified and well lateral of the primary incisional field.  Blunt dissection was then carried down through the fascia through the interval the brachioradialis and pronator masses, identifying the rent where the biceps lies natively.  The radial tuberosity was palpated.  Next, the biceps was found proximally in the  upper arm, hooked with my finger into direct visualization using an Army-Navy retractor to elevate the tissues anteriorly.  There was seroma formation.  Blunt dissection was performed and retraction of the soft tissues was achieved with Arm-Navy retractors and self-retaining retractors,  ensuring that only the dermis and subcutaneous tissues were retracted.  The tendon stump was retrieved using an Allis clamp at the tendon edge and the end was observed to be tendinotic.   The 2-3 mm end of the stump showed non-viable tissue.  This was debrided with scissors and the tendon width trimmed such that a 7 mm sizer could easily pass over the stump end.  Next, starting 20 mm proximal to the tendon stump edge, a Fiberloop whip stitch was placed towards the distal end of the tendon.  The slack was pulled out of the suture construct sequentially and then the last loop of the stitch was placed proximal to the prior loop to lock in place.      Next we turned our attention to the radial tuberosity exposure.  Full supination was applied by the assistant.  An Army-navy retractor was placed laterally with a baby Hohmann placed medially to the tuberosity with care to release retraction periodically to take tension off of the lateral antebrachial cutaneous nerve.  Using mini-fluoroscopy, the radial tuberosity was targeted using a freer elevator for placement at the radial tuberosity.  There was significant venous arborization at the level of the radial tuberosity and vessels were released and mobilized and/or ligated to facilitate repair and control bleeding.  This allowed for direct  visualization deeply of the radial tuberosity and a mini Hohmann retractor was placed ulnarly on the radius to aid in the visualization. There was minimal residual biceps tissue attached to the footprint and this was debrided.          The guide-pin was aligned within the footprint and central to the radial tuberosity in the proximal-distal dimension and gravitating towards the ulnar side of the footprint in the medial-lateral dimension while aiming 30 degrees ulnarly.  Forearm supination maintained maximally throughout drilling to aid in avoiding the posterior interosseous nerve.  Then the coring reamer was placed over the guidepin  after confirming the circumference would be completely within the tuberosity.  The initial cortex was reamed, then the reamer used by hand to go through the cancellous portion, confirming that the head of the reamer was buried subcortical to facilitate the tenodesis screw.  The bone was of good quality.  The field was copiously irrigated and suctioned to remove bony remnants thoroughly.  Betadine saline was also used for multiple washings, then irrigated with sterile saline.  Then the tails of the whip stitch were threaded through the biceps endobutton to fashion a tension slide construct.  The endobutton inserter was used to place the button on the far edge through the guidepin hole and flip the button.  Toggling was performed and the button was confirmed to be flipped using mini-fluoroscopy and was in a good position at the radial tuberosity.  Tension slide technique was used to draw the tendon into the tunnel while some elbow flexion was maintained.  The tendon was directly observed to be buried within the tunnel.  Knots were tied within the tunnel using an arthroscopic pusher advancing into the tunnel.  Then a free suture end was placed through the tenodesis screw, followed by using the driver to advance the tenodesis screw into the aperture while holding the tendon as ulnarly in the tunnel  as possible.  The screw had good purchase within the tunnel and the construct complete.  Mini-fluoroscopy was used to document final pictures with the endobutton flipped on the far cortex of the radius and aperture within the radial tuberosity expanse.  The field was copiously irrigated with betadine saine followed by sterile saline.  The subcutaneous tissue was closed with a 2-0 vicryl in an interrupted fashion while avoiding the lateral anebrachial cutaneous nerve which had been identified and protected throughout the case.    Steri-strips were placed with Dermabond closure over the top of the incision and Steri-Strips.    Sterile gauze and an abdominal pad was used with Soft Roll to pad the incisional area and the bony prominences of the elbow.  Sterile webril was used to wrap the arm to the wrist after tourniquet removed.  A posterior mold splint was fashioned with Ortho Glass.  More webril was used to cover the splinting material and an Ace wrap placed over top.  Sling was placed.  The case was complete.  The patient emerged from anesthesia and then transferred to the PACU in medically stable condition.      POST-PROCEDURE INSTRUCTIONS:  The patient was neurologically intact at the hand upon exam in the PACU.  hydrocodone/ acetaminophen was prescribed along with a stool softener.  Patient to remain in splint and sling until follow-up in clinic in 1-2 weeks with appointment already placed.  The patient is non-weight bearing to the Right upper extremity.

## 2019-09-24 NOTE — Nurses Notes (Signed)
Patient discharged home with family.  AVS reviewed with patient/care giver.  A written copy of the AVS and discharge instructions was given to the patient/care giver.  Questions sufficiently answered as needed.  Patient/care giver encouraged to follow up with PCP as indicated.  In the event of an emergency, patient/care giver instructed to call 911 or go to the nearest emergency room.

## 2019-09-24 NOTE — Anesthesia Postprocedure Evaluation (Signed)
Anesthesia Post Op Evaluation    Patient: Nathan Hale  Procedure(s) with comments:  REPAIR TENDON BICEPS - DISTAL    Last Vitals:Temperature: 36.7 C (98.1 F) (09/24/19 1415)  Heart Rate: 67 (09/24/19 1430)  BP (Non-Invasive): (!) 137/95 (09/24/19 1430)  Respiratory Rate: (!) 28 (09/24/19 1430)  SpO2: 94 % (09/24/19 1430)    Patient is sufficiently recovered from the effects of anesthesia to participate in the evaluation and has returned to their pre-procedure level.  Patient location during evaluation: PACU       Patient participation: complete - patient participated  Level of consciousness: awake and alert and responsive to verbal stimuli  Multimodal Pain Management: Multimodal analgesia used between 6 hours prior to anesthesia start to PACU discharge  Pain management: adequate  Airway patency: patent    Anesthetic complications: no  Cardiovascular status: acceptable  Respiratory status: acceptable  Hydration status: acceptable  Patient post-procedure temperature: Pt Normothermic   PONV Status: Absent

## 2019-09-24 NOTE — Anesthesia Preprocedure Evaluation (Addendum)
ANESTHESIA PRE-OP EVALUATION  Planned Procedure: REPAIR TENDON BICEPS (Right Shoulder)  Review of Systems                   Pulmonary  negative pulmonary ROS,    Cardiovascular  negative cardio ROS,          GI/Hepatic/Renal    GERD     Endo/Other    obesity,       Neuro/Psych/MS   negative neuro/psych ROS,      Cancer  negative hematology/oncology ROS,                    Physical Assessment      Airway       Mallampati: I    TM distance: >3 FB    Neck ROM: full  Mouth Opening: good.  Facial hair  No Beard        Dental       Dentition intact             Pulmonary    Breath sounds clear to auscultation  (-) no wheezes     Cardiovascular    Rhythm: regular  Rate: Normal       Other findings            Plan  ASA 2     Planned anesthesia type: general              Intravenous induction     Anesthesia issues/risks discussed are: Dental Injuries, Post-op Agitation/Tantrum, Blood Loss, Nerve Injuries, Cardiac Events/MI, Stroke, Sore Throat, PONV, Intraoperative Awareness/ Recall and Post-op Cognitive Dysfunction.  Anesthetic plan and risks discussed with patient.      Use of blood products discussed with patient who.     Patient's NPO status is appropriate for Anesthesia.           Plan discussed with CRNA and attending.

## 2019-09-24 NOTE — Anesthesia Transfer of Care (Signed)
ANESTHESIA TRANSFER OF CARE   Nathan Hale is a 53 y.o. ,male, Weight: 128 kg (282 lb 3 oz)   had Procedure(s) with comments:  REPAIR TENDON BICEPS - DISTAL  performed  09/24/19   Primary Service: Drucilla Schmidt, MD    Past Medical History:   Diagnosis Date   . Esophageal reflux    . Ulcerative colitis ([facility] [place])    . Wears glasses       Allergy History as of 09/24/19      No Known Allergies              I completed my transfer of care / handoff to the receiving personnel during which we discussed:  Access, Airway, All key/critical aspects of case discussed, Analgesia, Antibiotics, Expectation of post procedure, Fluids/Product, Gave opportunity for questions and acknowledgement of understanding, Labs and PMHx    Post Location: PACU                                          Additional Info:Report to rn, care relinquished                      Last OR Temp: Temperature: 36.5 C (97.7 F)  ABG:  POTASSIUM   Date Value Ref Range Status   09/16/2019 4.3 3.5 - 5.1 mmol/L Final     KETONES   Date Value Ref Range Status   09/16/2019 Negative Negative mg/dL Final     CALCIUM   Date Value Ref Range Status   09/16/2019 9.3 8.5 - 10.2 mg/dL Final     Calculated P Axis   Date Value Ref Range Status   09/16/2019 43 degrees Final     Calculated T Axis   Date Value Ref Range Status   09/16/2019 35 degrees Final     Airway:* No LDAs found *  Blood pressure (!) 134/92, pulse 65, temperature 36.5 C (97.7 F), resp. rate 18, height 1.82 m (5' 11.65"), weight 128 kg (282 lb 3 oz), SpO2 94 %.

## 2019-10-06 ENCOUNTER — Encounter (HOSPITAL_BASED_OUTPATIENT_CLINIC_OR_DEPARTMENT_OTHER): Payer: Self-pay | Admitting: ORTHOPEDIC, SPORTS MEDICINE

## 2019-10-06 ENCOUNTER — Ambulatory Visit: Payer: BC Managed Care – PPO | Attending: ORTHOPEDIC, SPORTS MEDICINE | Admitting: ORTHOPEDIC, SPORTS MEDICINE

## 2019-10-06 ENCOUNTER — Other Ambulatory Visit: Payer: Self-pay

## 2019-10-06 DIAGNOSIS — Z9889 Other specified postprocedural states: Secondary | ICD-10-CM | POA: Insufficient documentation

## 2019-10-06 DIAGNOSIS — S46211A Strain of muscle, fascia and tendon of other parts of biceps, right arm, initial encounter: Secondary | ICD-10-CM

## 2019-10-06 DIAGNOSIS — S46211D Strain of muscle, fascia and tendon of other parts of biceps, right arm, subsequent encounter: Secondary | ICD-10-CM | POA: Insufficient documentation

## 2019-10-06 NOTE — Progress Notes (Signed)
PATIENT NAME: Heyburn NUMBER:  [id number]  DATE OF SERVICE: 10/06/2019  DATE OF BIRTH:  [birth year]    PROGRESS NOTE    DATE OF SURGERY:  September 24, 2019.    PROCEDURE:  Right distal biceps tendon repair.    SUBJECTIVE:  Mr. Longton is a 53 year old male presenting today 2 weeks status post the above procedure.  He says he has been staying in his splint and has had no issues. He is having a little bit of tingling on the lateral volar portion of his forearm, but otherwise he has been doing very well.    OBJECTIVE:  General:  This is a well-appearing male in no apparent distress.  No vitals taken in clinic today.  Examination of the right upper extremity shows the incision to be well healing with no drainage, redness, or warmth.  Steri-Strips were removed in clinic today.  He has some paresthesias over the lateral antebrachial cutaneous nerve distribution.  He has no numbness or tingling in his hand.  He has full strength in his hand.  He has a 2+ radial pulse.  He is able to extend to about 100-110 degrees at his elbow.    ASSESSMENT AND PLAN:  This is a 53 year old male 2 weeks status post a right distal biceps tendon repair on September 24, 2019.  He was transitioned to a hinged elbow brace today to be locked with no extension past 100 degrees.  He was also given a script for physical therapy to work on increasing range of motion and strength per protocol.  He is to stay in his hinged elbow brace except at therapy where they can remove the brace.  He was also given a script to be light duty at work.  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  He will be seen back on November 03, 2019, for his 6-week followup visit.  He was counseled not to do any soaking of his incision or rubbing any ointments on it.  He will call the clinic if he has any questions or concerns.  If not, we will see him back at his 6-week followup  appointment.        Hoover Brunette, MD      ____________________________________________________________________________________________  I have seen and examined the patient with the resident, reviewed and agree with Dr. Blanchie Serve note with the following additions/addendums as written.  I personally went over the plan and findings with the patient.  Patient is doing well incisional wounds look well.  His motion is appropriate.  The tendon still appears intact.  Patient was fitted with a hinged elbow brace, and is nonweightbearing to the right upper extremity.  He will start physical therapy.  We will see him back at the 6 week postoperative appointment.    Kitara Hebb Geraldine Contras, MD  Assistant Professor  [facility]              DD:  10/06/2019 11:03:50  DT:  10/06/2019 12:08:52 CB  D#:  [id number]

## 2019-11-03 ENCOUNTER — Ambulatory Visit: Payer: BC Managed Care – PPO | Attending: ORTHOPEDIC, SPORTS MEDICINE | Admitting: ORTHOPEDIC, SPORTS MEDICINE

## 2019-11-03 ENCOUNTER — Other Ambulatory Visit: Payer: Self-pay

## 2019-11-03 DIAGNOSIS — Z09 Encounter for follow-up examination after completed treatment for conditions other than malignant neoplasm: Secondary | ICD-10-CM | POA: Insufficient documentation

## 2019-11-03 DIAGNOSIS — S46211D Strain of muscle, fascia and tendon of other parts of biceps, right arm, subsequent encounter: Secondary | ICD-10-CM

## 2019-11-03 NOTE — Progress Notes (Signed)
PATIENT NAME: Nathan Hale:  [id number]  DATE OF SERVICE: 11/03/2019  DATE OF BIRTH:  December 21, 1965    PROGRESS NOTE    DATE OF SURGERY:  September 24, 2019.    PROCEDURE:  Right distal biceps repair.    SUBJECTIVE:  Mr. Nathan Hale is a 53 year old male presenting today 6 weeks status post the above procedure.  He has been in his hinged elbow brace and going to physical therapy.  He says the physical therapy has been going very well.  He is very happy and thinks he has full range of motion.  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    OBJECTIVE:  In general, he is well-appearing male in no apparent distress.  Examination of the right upper extremity shows sensation to be intact in the radial, median, and ulnar nerve distributions.  He has some paresthesias over the lateral antebrachial cutaneous nerve distribution, but this is improved over the last month.  He has full range of motion symmetric to the contralateral side.  He has 2+ radial pulse.  His biceps tendon is palpable in his AC fossa.    ASSESSMENT AND PLAN:  This is a 53 year old male presenting today 6 weeks status post a right distal biceps tendon repair on September 24, 2019.  He is to continue doing his physical therapy per protocol.  He was given a new work slip to maintain light duty because he should not be lifting any more than 15 pounds.  He expressed understanding of this.  He does not need to stay in his hinged elbow brace all of the time, but he can use it if he is doing any sort of activities at work.  The hinged elbow brace is unlocked at this time.  We will see him back on December 22, 2019, at which time we anticipate releasing him to full duty.  He expressed understanding of this and was counseled to call the clinic back if he has any questions or concerns.        Nathan Brunette,  MD    ____________________________________________________________________________________________  I have seen and examined the patient with the resident, reviewed and agree with Dr. Deneen Hale note with the following additions/addendums as written.  I personally went over the plan and findings with the patient.  Patient is doing well and ROM is normal.  Continue to progress with strengthening and avoidance of heavy lifting for 6 weeks.  Will re-eval and expect to discharge from clinic.  Scar massage techniques outlined.    Nathan Shew Geraldine Contras, MD  Assistant Professor  [facility]          DD:  11/03/2019 11:38:06  DT:  11/03/2019 13:32:29 TC  D#:  [id number]

## 2019-12-22 ENCOUNTER — Other Ambulatory Visit: Payer: Self-pay

## 2019-12-22 ENCOUNTER — Encounter (HOSPITAL_BASED_OUTPATIENT_CLINIC_OR_DEPARTMENT_OTHER): Payer: Self-pay | Admitting: ORTHOPEDIC, SPORTS MEDICINE

## 2019-12-22 ENCOUNTER — Ambulatory Visit: Payer: BC Managed Care – PPO | Attending: ORTHOPEDIC, SPORTS MEDICINE | Admitting: ORTHOPEDIC, SPORTS MEDICINE

## 2019-12-22 DIAGNOSIS — S46211D Strain of muscle, fascia and tendon of other parts of biceps, right arm, subsequent encounter: Secondary | ICD-10-CM | POA: Insufficient documentation

## 2019-12-22 NOTE — Progress Notes (Signed)
PATIENT NAME: Nathan Hale, Nathan Hale  HOSPITAL NUMBER:  [id number]  DATE OF SERVICE: 12/22/2019  DATE OF BIRTH:  1966/01/15    PROGRESS NOTE    DATE OF SURGERY:  September 24, 2019, right distal biceps repair.    SUBJECTIVE:  This is a 54 year old gentleman who is 3 months out from surgery.  He has been doing fine.  He has completed physical therapy and has no pain in his arm.  He has been returning to activities as normal.  He wants to go back to work.  No issues with his wound.    OBJECTIVE:  In regard to right upper extremity, he is neurovascularly intact.  He does have just a little bit of numbness but it is not really numbness but more tingling on his volar lateral aspect of his forearm.  His incision is nicely healed.  He gets out fully straight and flexes back fully.  He can feel that his distal biceps tendon is intact.  He has no pain with resisted supination of his forearm.    IMAGING:  No new imaging today.    ASSESSMENT:  A 54 year old gentleman who is 3 months out from the above-mentioned surgery.    PLAN:  He has no restrictions from our standpoint.  He is to be smart about his arm and lifting.  He can go back to work.  We will have to see him on an as-needed basis.  The patient is agreeable with the plan.  All questions were answered.        Gray Bernhardt, MD    ____________________________________________________________________________________________  I have seen and examined the patient with the resident, reviewed and agree with Dr. Coral Else note with the following additions/addendums as written.  I personally went over the plan and findings with the patient.  Patient has no range of motion deficit with pronation/supination and flexion extension.  Strength is appropriate.  Negative hook test.  He perceives no deficit.  He does have improving neurologic function of the lateral antebrachial cutaneous nerve and we anticipate slow and steady return of full sensation.  Patient will follow up as needed.           Yaelis Scharfenberg Marin Comment, MD  Assistant Professor  [facility]            DD:  12/22/2019 11:26:14  DT:  12/22/2019 13:06:23 DG  D#:  [id number]

## 2023-11-26 IMAGING — MR MRI KNEE LT W/O CONTRAST
5 series · 40 of 40 positions shown · IV contrast (gadolinium)
Comparison: Outside x-ray report dated 10/30/2023.

﻿EXAM:  02053   MRI KNEE LT W/O CONTRAST
INDICATION: 57-year-old with persistent lateral knee pain.  Swelling.  Previous surgery for ACL reconstruction and meniscus surgery 15 years ago.
TECHNIQUE: Multiplanar, multisequential MRI of the left knee was performed without gadolinium contrast.

[Series 5: PD fat-sat · axial · left · 4.5mm · 0.53mm/px · z∈[-76,+69]mm · 9 of 30 slices shown (1 of 3)]
[im 1/30]
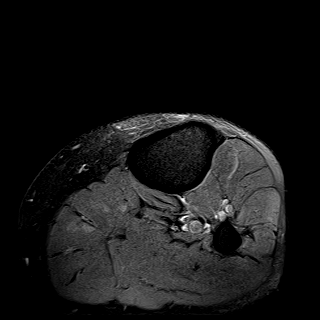
[im 4/30]
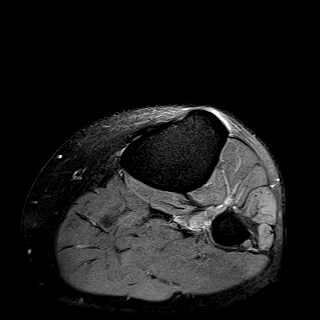
[im 8/30]
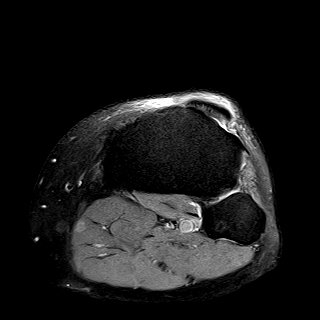
[im 11/30]
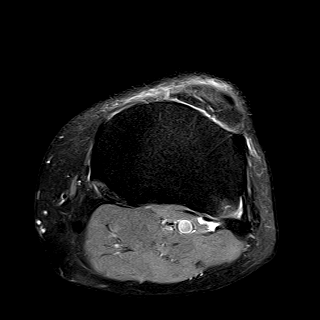
[im 15/30]
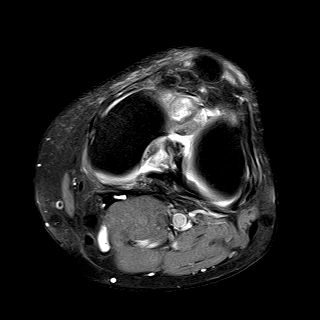
[im 19/30]
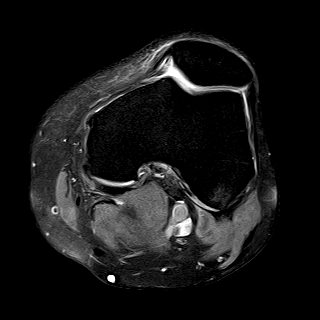
[im 22/30]
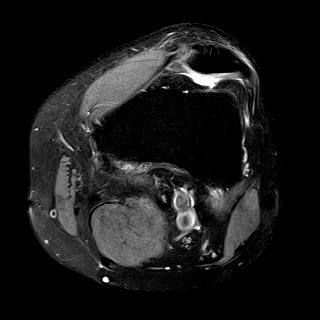
[im 26/30]
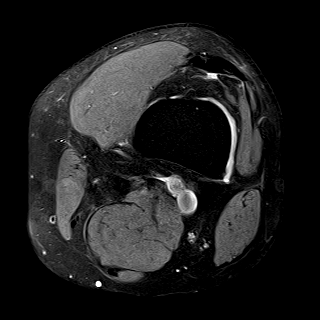
[im 30/30]
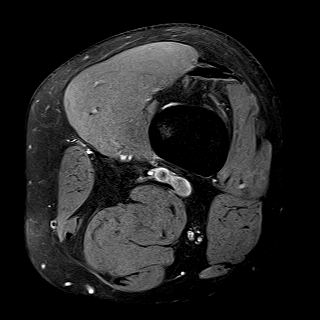

[Series 6: PD fat-sat · sagittal · left · 3.5mm · 0.47mm/px · 8 of 30 slices shown (2 of 3)]
[im 1/30]
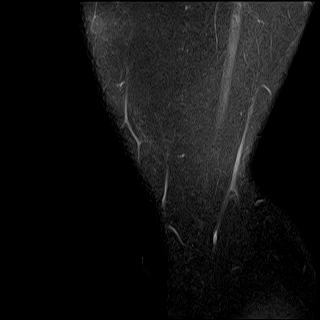
[im 5/30]
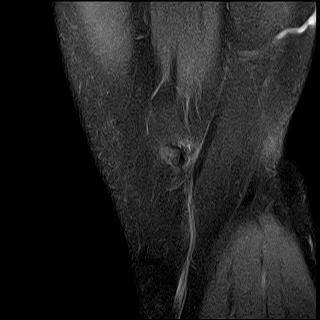
[im 9/30]
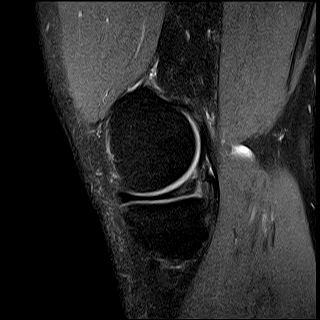
[im 13/30]
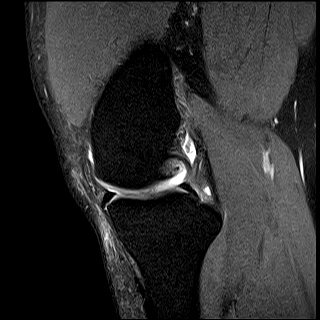
[im 17/30]
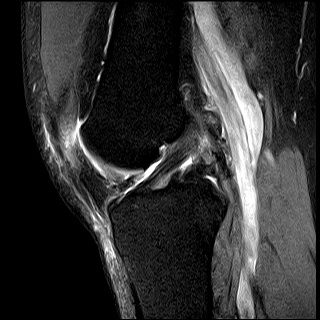
[im 21/30]
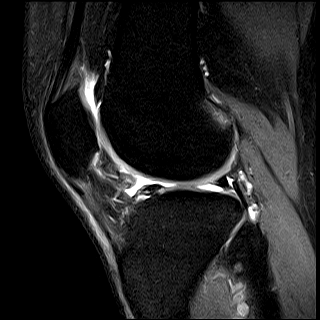
[im 25/30]
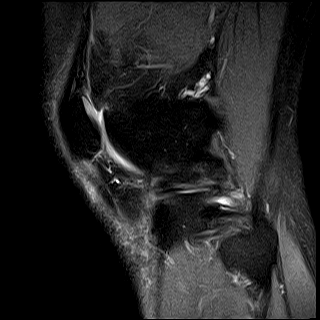
[im 30/30]
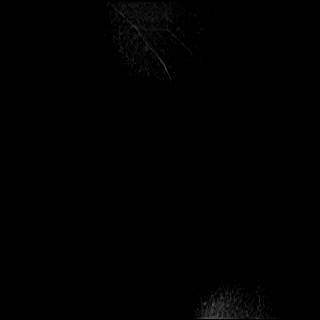

[Series 7: T1 · sagittal · left · 3.5mm · 0.39mm/px · 8 of 30 slices shown]
[im 1/30]
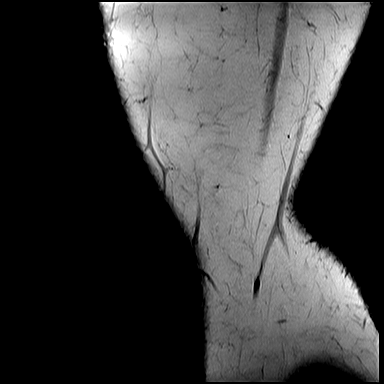
[im 5/30]
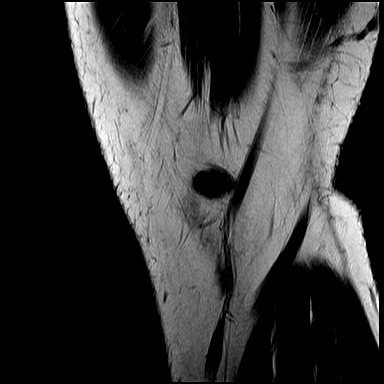
[im 9/30]
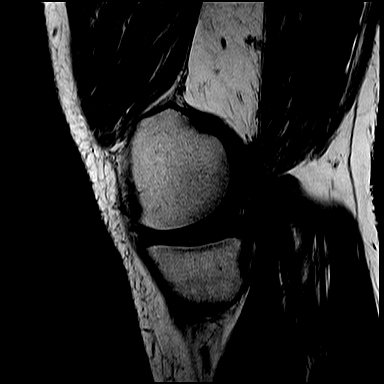
[im 13/30]
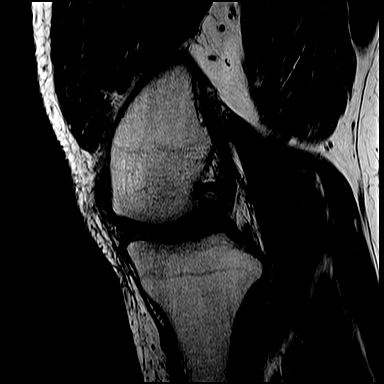
[im 17/30]
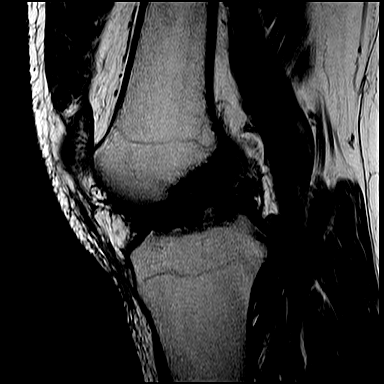
[im 21/30]
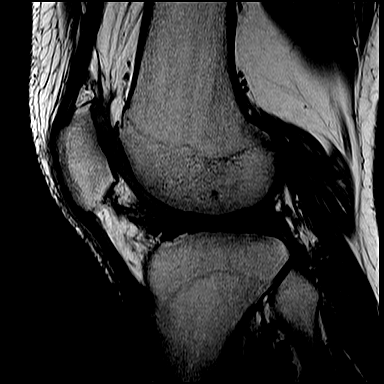
[im 25/30]
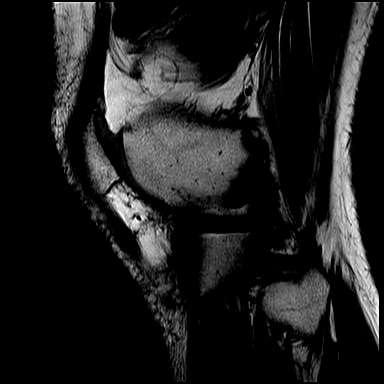
[im 30/30]
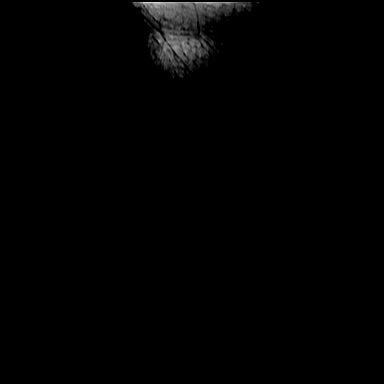

[Series 8: STIR · coronal · left · 3.5mm · 0.47mm/px · 7 of 26 slices shown]
[im 1/26]
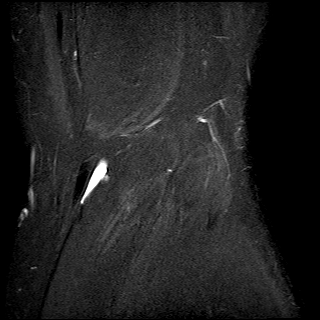
[im 5/26]
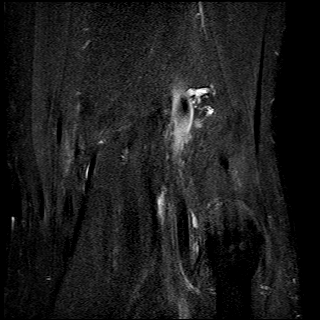
[im 9/26]
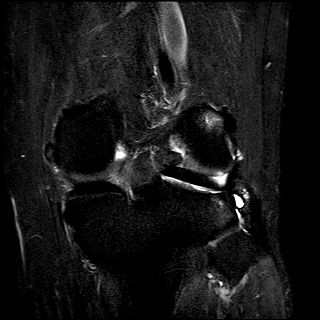
[im 13/26]
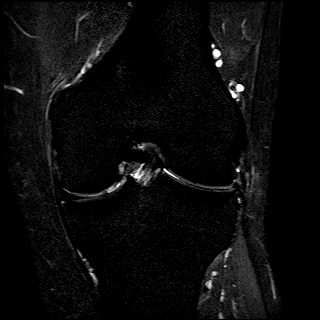
[im 17/26]
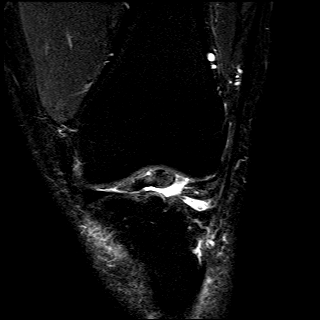
[im 21/26]
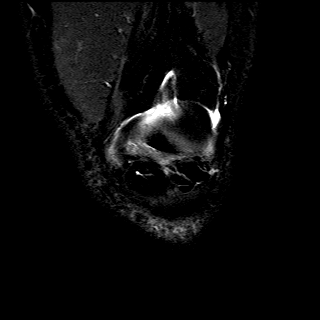
[im 26/26]
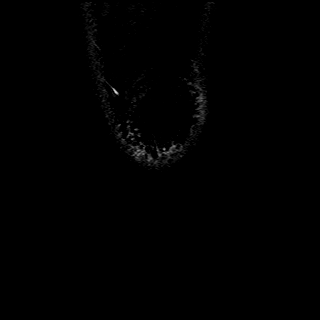

[Series 9: PD fat-sat · coronal · left · 3.5mm · 0.47mm/px · 8 of 27 slices shown (3 of 3)]
[im 1/27]
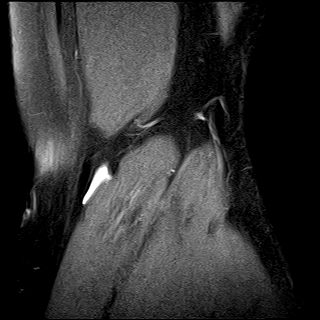
[im 4/27]
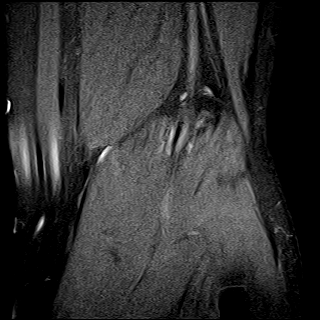
[im 8/27]
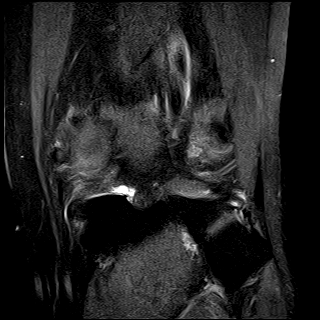
[im 12/27]
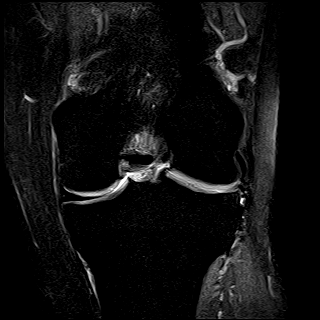
[im 15/27]
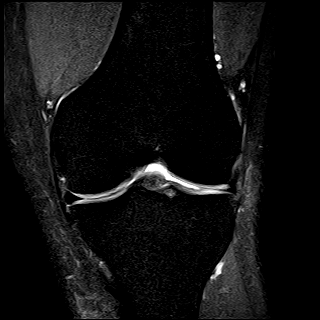
[im 19/27]
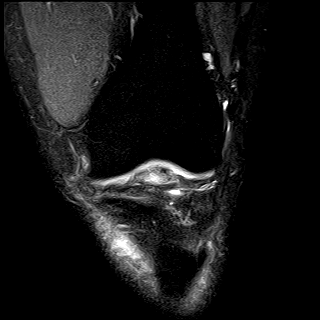
[im 23/27]
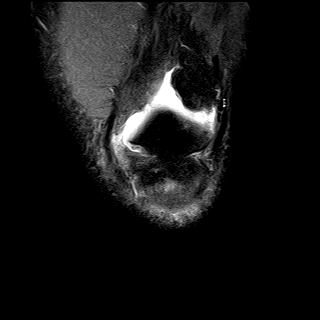
[im 27/27]
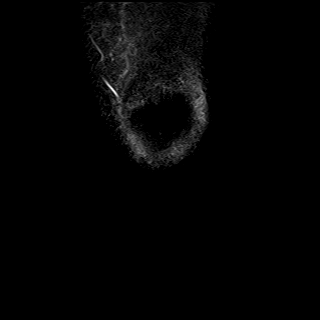

[40 of 40 positions shown; findings below may reference images not displayed]

FINDINGS: No acute bony lesions are seen at the left knee. 

Deformity of the mid lateral meniscus suggests previous partial meniscectomy.  Small parameniscal cyst of the anterior horn of the lateral meniscus is noted 4 mm in diameter.  Grade 3 degenerative changes of lateral articular cartilage are noted.

Anterior and posterior cruciate ligaments are intact.  Grade 2 degenerative changes of medial articular cartilage. Medial meniscus shows no acute findings.

Grade 2 degenerative changes of lateral facet of patellofemoral articulation.  Inflammatory changes of infrapatellar Hoffa's fat pad are noted.  Prepatellar, infrapatellar bursitis is noted.
IMPRESSION: 1. Grade 3 degenerative changes of lateral articular cartilage.  Likely postsurgical changes of lateral meniscus.  A 4 mm cyst of the anterior horn of the lateral meniscus suggestive of parameniscal cyst.

2. Grade 2 degenerative changes of medial articular cartilage. 

3. Grade 2 degenerative changes of lateral facet of patellofemoral articulation.

4. Prepatellar, infrapatellar bursitis.  Inflammatory changes of infrapatellar Hoffa's fat pad.  Small effusion in the knee joint.
